# Patient Record
Sex: Male | Born: 1990 | Race: White | Hispanic: No | Marital: Single | Smoking: Never smoker
Health system: Southern US, Community
[De-identification: ages and names within clinical notes are randomized; demographics above are authoritative.]

## PROBLEM LIST (undated history)

## (undated) DIAGNOSIS — G809 Cerebral palsy, unspecified: Secondary | ICD-10-CM

## (undated) HISTORY — PX: EYE SURGERY: SHX253

## (undated) HISTORY — PX: ORTHOPEDIC SURGERY: SHX850

---
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
## 2014-01-15 ENCOUNTER — Ambulatory Visit: Payer: Self-pay | Admitting: Family Medicine

## 2015-08-17 ENCOUNTER — Other Ambulatory Visit: Payer: Self-pay | Admitting: Nurse Practitioner

## 2015-08-17 DIAGNOSIS — G804 Ataxic cerebral palsy: Secondary | ICD-10-CM

## 2015-08-17 DIAGNOSIS — M5410 Radiculopathy, site unspecified: Secondary | ICD-10-CM

## 2015-09-15 ENCOUNTER — Ambulatory Visit
Admission: RE | Admit: 2015-09-15 | Discharge: 2015-09-15 | Disposition: A | Discharge status: Home / Self Care | Payer: BC Managed Care – PPO | Source: Ambulatory Visit | Attending: Nurse Practitioner | Admitting: Nurse Practitioner

## 2015-09-15 DIAGNOSIS — G804 Ataxic cerebral palsy: Secondary | ICD-10-CM

## 2015-09-15 DIAGNOSIS — M5410 Radiculopathy, site unspecified: Secondary | ICD-10-CM

## 2015-09-15 MED ORDER — GADOBENATE DIMEGLUMINE 529 MG/ML IV SOLN
15.0000 mL | Freq: Once | INTRAVENOUS | Status: AC | PRN
  Administered 2015-09-15: 11 mL via INTRAVENOUS

## 2015-10-06 ENCOUNTER — Ambulatory Visit: Payer: BC Managed Care – PPO | Attending: Nurse Practitioner | Admitting: Physical Therapy

## 2015-10-06 DIAGNOSIS — M545 Low back pain, unspecified: Secondary | ICD-10-CM

## 2015-10-06 NOTE — Therapy (Addendum)
[place] [facility] MAIN [facility] SERVICES [address] [place], [place], [address] Phone: [phone]   Fax:  [phone]  Physical Therapy Evaluation  Patient Details  Name: AMONTAE NG MRN: [id number] Date of Birth: May 29, 1991 No Data Recorded  Encounter Date: 10/06/2015      PT End of Session - 10/06/15 1650    Visit Number 1   Number of Visits 25   Date for PT Re-Evaluation 12/28/15   PT Start Time 0430   PT Stop Time 0515   PT Time Calculation (min) 45 min      No past medical history on file.  No past surgical history on file.  There were no vitals filed for this visit.  Visit Diagnosis:  Low back pain at multiple sites - Plan: PT plan of care cert/re-cert      Subjective Assessment - 10/06/15 1642    Subjective Patient had a fall  in september , he had PT for 3 weeks and his pain got worse. He had a MRI with no positive results. [facility] wants to try [facility] and [facility] some water while he was walking. 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    Pertinent History CP, lenthening HC x 2 BLE, hamstring lengthening BLE, Hip surgery to prevent Adduction, foot restruction BLE, adductor lengthening   How long can you sit comfortably? 5 minutes   How long can you stand comfortably? 5 minutes   How long can you walk comfortably? 10 minutes   Diagnostic tests MRI   Patient Stated Goals have less pain   Currently in Pain? Yes   Pain Score 4    Pain Location Back   Pain Orientation Right;Left   Pain Descriptors / Indicators Stabbing;Constant;Penetrating   Pain Type Chronic pain      PAIN: + SLR crossed, prone knee flex test + , palpation tenderness to L4-L5  POSTURE: WFL   PROM/AROM: tight hamstrings, hip adductors, hip flexors,  BLE,   STRENGTH:  Graded on a 0-5 scale NT due to CP and muscle tightness and  spasticity                                                                  SENSATION: WNL   SPECIAL TESTS: + crossed SLR, + prone flex test, + spring test L4, L5  FUNCTIONAL MOBILITY: guarded and painful supine to prone and supine to sit    GAIT: Gait training with spc with slow gait speed due to back and thigh pain. Antalgic gait due to back pain   :                              Mechanical traction in supine 70 lbs 25 sec on/ 10 sec off x 15 minutes with patient reporting some pain relief following traction                          PT Education - 10/06/15 1648    Education provided Yes   Education Details  POC   Person(s) Educated Patient   Methods Explanation   Comprehension Verbalized understanding             PT Long Term Goals - 10/07/15 0903    PT LONG TERM GOAL #1   Title Patient will be independent in home exercise program to improve strength/mobility for better functional independence with ADLs   Time 8   Period Weeks   Status New   PT LONG TERM GOAL #2   Title Patient will report a worst pain of 3/10 on VAS in    low back         to improve tolerance with ADLs and reduced symptoms with activities   Time 8   Period Weeks   Status New   PT LONG TERM GOAL #3   Title Patient will increase lumbar extension strength to at least 4/5 as to improve gross strength for sitting/standing tolerance with better erect posture for increased tolerance with ADLs.    Time 8   Period Weeks   Status New               Plan - 10/07/15 0905    Clinical Impression Statement Patient is 25 yr old male who presents with low back pain and thigh pain that began after a fall 3 months ago. He is now ambulating with a spc due to pain and is not able to walk his required lengths to complete college  requirements to get to class.    Pt will benefit from skilled therapeutic intervention in order to improve on the following deficits Abnormal  gait;Pain;Decreased balance;Difficulty walking;Impaired flexibility;Decreased activity tolerance;Decreased endurance;Decreased range of motion;Decreased strength   Rehab Potential Fair   Clinical Impairments Affecting Rehab Potential Patients clinical presentation is evolving because his pain was one month ago and it is not getting better.   PT Frequency 2x / week   PT Duration 12 weeks   PT Treatment/Interventions Therapeutic exercise;Therapeutic activities;Gait training;Manual techniques;Cryotherapy;Electrical Stimulation;Other (comment)  ;umbar traction         Problem List There are no active problems to display for this patient.  Ezekiel Ina, PT, DPT [place], [place] S 10/07/2015, 12:25 PM  [place] [facility] MAIN [facility] SERVICES [address] [place], [place], [address] Phone: [phone]   Fax:  [phone]  Name: LEEVON UPPERMAN MRN: [id number] Date of Birth: 01/10/1991

## 2015-10-08 ENCOUNTER — Ambulatory Visit: Payer: BC Managed Care – PPO | Admitting: Physical Therapy

## 2015-10-08 DIAGNOSIS — M545 Low back pain, unspecified: Secondary | ICD-10-CM

## 2015-10-08 NOTE — Therapy (Signed)
[facility] MAIN [facility] SERVICES [address] [place], [place], [address] Phone: [phone]   Fax:  [phone]  Physical Therapy Treatment  Patient Details  Name: CHRYSTOPHER STANGL MRN: [id number] Date of Birth: [birth year] No Data Recorded  Encounter Date: 10/08/2015      PT End of Session - 10/08/15 1403    Visit Number 2   Number of Visits 25   Date for PT Re-Evaluation 12/28/15   PT Start Time 0100   PT Stop Time 0145   PT Time Calculation (min) 45 min   Equipment Utilized During Treatment Gait belt   Activity Tolerance Patient tolerated treatment well   Behavior During Therapy [facility] for tasks assessed/performed      No past medical history on file.  No past surgical history on file.  There were no vitals filed for this visit.  Visit Diagnosis:  Low back pain at multiple sites      Subjective Assessment - 10/08/15 1357    Subjective Patient had a fall  in september , he had PT for 3 weeks and his pain got worse. He had a MRI with no positive results. [facility] wants to try traction and they dont have it in the [facility]He fell on some water while he was walking. 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    Pertinent History CP, lenthening HC x 2 BLE, hamstring lengthening BLE, Hip surgery to prevent Adduction, foot restruction BLE, adductor lengthening   How long can you sit comfortably? 5 minutes   How long can you stand comfortably? 5 minutes   How long can you walk comfortably? 10 minutes   Diagnostic tests MRI   Patient Stated Goals have less pain     Patient was seen for lumbar traction 70 lbs on 20 sec/ off 10 sec for 20 minutes. Manual therapy for RLE hip : long axis distraction 30 bouts x 15 minutes with patient reporting decreased pain after treatment with less pressure. Instructed in HEP.                            PT  Education - 10/08/15 1359    Education provided Yes   Education Details POC   Person(s) Educated Patient   Methods Explanation   Comprehension Verbalized understanding             PT Long Term Goals - 10/07/15 0903    PT LONG TERM GOAL #1   Title Patient will be independent in home exercise program to improve strength/mobility for better functional independence with ADLs   Time 8   Period Weeks   Status New   PT LONG TERM GOAL #2   Title Patient will report a worst pain of 3/10 on VAS in    low back         to improve tolerance with ADLs and reduced symptoms with activities   Time 8   Period Weeks   Status New   PT LONG TERM GOAL #3   Title Patient will increase lumbar extension strength to at least 4/5 as to improve gross strength for sitting/standing tolerance with better erect posture for increased tolerance with ADLs.    Time 8   Period Weeks   Status New  Plan - 10/08/15 1404    Clinical Impression Statement Patient has 3/10 back and upper leg pain today and he says that he did not do any walking which can make it worse. Patietn was able to tolerate lumbar traction and manual therapy for lon axis distraction on RLE to decrease his low back and leg pain.    Pt will benefit from skilled therapeutic intervention in order to improve on the following deficits Abnormal gait;Pain;Decreased balance;Difficulty walking;Impaired flexibility;Decreased activity tolerance;Decreased endurance;Decreased range of motion;Decreased strength   Rehab Potential Fair   Clinical Impairments Affecting Rehab Potential Patients clinical presentation is evolving because his pain was one month ago and it is not getting better.   PT Frequency 2x / week   PT Duration 12 weeks   PT Treatment/Interventions Therapeutic exercise;Therapeutic activities;Gait training;Manual techniques;Cryotherapy;Electrical Stimulation;Other (comment)  ;umbar traction        Problem List There are  no active problems to display for this patient.  Ezekiel Ina, PT, DPT [place], [place] S 10/08/2015, 2:09 PM  [place] [facility] MAIN [facility] SERVICES [address] [place], [place], [address] Phone: [phone]   Fax:  [phone]  Name: NAHUN KRONBERG MRN: [id number] Date of Birth: [birth year]

## 2015-10-13 ENCOUNTER — Ambulatory Visit: Payer: BC Managed Care – PPO | Admitting: Physical Therapy

## 2015-10-15 ENCOUNTER — Ambulatory Visit: Payer: BC Managed Care – PPO | Admitting: Physical Therapy

## 2015-10-15 ENCOUNTER — Encounter: Payer: Self-pay | Admitting: Physical Therapy

## 2015-10-15 DIAGNOSIS — M545 Low back pain, unspecified: Secondary | ICD-10-CM

## 2015-10-15 NOTE — Patient Instructions (Addendum)
Pelvic Tilt  Lying on back with knees bent, Flatten back by tightening stomach muscles and rocking hips back Hold for 5 sec, Repeat __10__ times per set. Do __1__ sets per session. Do __2__ sessions per day.  http://orth.exer.us/134    Copyright  VHI. All rights reserved. Knee to Chest (Flexion)   Pull both knee toward chest. Feel stretch in lower back or buttock area. Breathing deeply, Hold __15__ seconds. Repeat with other knee. Repeat _2-3___ times. Do _2-3___ sessions per day.  http://gt2.exer.us/225   Copyright  VHI. All rights reserved.   Lower Trunk Rotation Stretch  Lying on back with knees bent, Keeping back flat and feet together, rotate knees side to side slowly and in pain free range of motion.  Hold _2___ seconds. Repeat for 1-2 minutes. Do __1__ sets per session. Do __2-3__ sessions per day.  http://orth.exer.us/122      Spinal Mobility (Cat / Camel): Flexion / Extension    Get ON TARGET. Knees on full roller, hands on flat up roller:  1.Cat: Buttocks up, arch spine segmentally, bottom to top: lift chest as head moves back, look up. 2.Camel: Reverse movement. Close eyes, lower head, tuck chin, compress chest and abdomen, round back. Hold _5__ seconds. Repeat _5__ times. Do __2_ sessions per day.  Copyright  VHI. All rights reserved.  Child Pose    Sitting on knees, fold body over legs and relax head and arms on floor. Hold for _2-3___ breaths. Repeat x2 reps  http://yg.exer.us/126   Copyright  VHI. All rights reserved.

## 2015-10-15 NOTE — Therapy (Signed)
[facility] MAIN [facility] SERVICES [address] [place], [place], [address] Phone: [phone]   Fax:  [phone]  Physical Therapy Treatment  Patient Details  Name: Omar West MRN: [id number] Date of Birth: Feb 03, 1991 No Data Recorded  Encounter Date: 10/15/2015      PT End of Session - 10/15/15 1427    Visit Number 3   Number of Visits 25   Date for PT Re-Evaluation 12/28/15   PT Start Time 1345   PT Stop Time 1435   PT Time Calculation (min) 50 min   Equipment Utilized During Treatment Gait belt   Activity Tolerance Patient tolerated treatment well;No increased pain   Behavior During Therapy [facility] for tasks assessed/performed      History reviewed. No pertinent past medical history.  History reviewed. No pertinent past surgical history.  There were no vitals filed for this visit.  Visit Diagnosis:  Low back pain at multiple sites      Subjective Assessment - 10/15/15 1354    Subjective Patient reports feeling more sore today; He reports relief from traction for about a day afterward and then the pain returns;    Pertinent History CP, lenthening HC x 2 BLE, hamstring lengthening BLE, Hip surgery to prevent Adduction, foot restruction BLE, adductor lengthening   How long can you sit comfortably? 5 minutes   How long can you stand comfortably? 5 minutes   How long can you walk comfortably? 10 minutes   Diagnostic tests MRI   Patient Stated Goals have less pain   Currently in Pain? Yes   Pain Score 4    Pain Location Back   Pain Orientation Lower   Pain Descriptors / Indicators Aching;Dull   Pain Type Chronic pain       TREATMENT: PT instructed patient in repeated flexed/extension; Patient reports increased discomfort with repeated extension; PT identified increased scoliotic curve with increased right lumbar paraspinal prominence and slight convex right, concave left in lower thoracic, upper lumbar region;  PT  instructed patient in lumbar flexion exercise: Hooklying:  Double knee to chest 20 sec hold x3; Lumbar trunk rotation x1 min with cues to avoid painful ROM and improve flexibility; Diaphragmatic breathing with posterior pelvic tilt x10;  Qped: Cat/camel stretch 5 sec hold x5 childs pose 20 sec hold x2;  Patient required min-moderate verbal/tactile cues for correct exercise technique and to increase core abdominal stabilization with UE/LE movement  PT applied lumbar mechanical traction, 85# pull, 50# rest, 20 sec on, 10 sec rest x 15 min; Patient reports less back after traction;                            PT Education - 10/15/15 1426    Education provided Yes   Education Details HEP, traction   Person(s) Educated Patient   Methods Explanation;Verbal cues;Handout   Comprehension Verbalized understanding;Returned demonstration;Verbal cues required             PT Long Term Goals - 10/07/15 0903    PT LONG TERM GOAL #1   Title Patient will be independent in home exercise program to improve strength/mobility for better functional independence with ADLs   Time 8   Period Weeks   Status New   PT LONG TERM GOAL #2   Title Patient will report a worst pain of 3/10 on VAS in    low back         to improve  tolerance with ADLs and reduced symptoms with activities   Time 8   Period Weeks   Status New   PT LONG TERM GOAL #3   Title Patient will increase lumbar extension strength to at least 4/5 as to improve gross strength for sitting/standing tolerance with better erect posture for increased tolerance with ADLs.    Time 8   Period Weeks   Status New               Plan - 10/15/15 1427    Clinical Impression Statement PT instructed patient in lumbar flexion stretches; He reports slight increased in discomfort with stretches but not significant; PT applied mechanical traction with increased pull to reduce discomfort. Patient reports less pain after  treatment session;    Pt will benefit from skilled therapeutic intervention in order to improve on the following deficits Abnormal gait;Pain;Decreased balance;Difficulty walking;Impaired flexibility;Decreased activity tolerance;Decreased endurance;Decreased range of motion;Decreased strength   Rehab Potential Fair   Clinical Impairments Affecting Rehab Potential Patients clinical presentation is evolving because his pain was one month ago and it is not getting better.   PT Frequency 2x / week   PT Duration 12 weeks   PT Treatment/Interventions Therapeutic exercise;Therapeutic activities;Gait training;Manual techniques;Cryotherapy;Electrical Stimulation;Other (comment)  ;umbar traction        Problem List There are no active problems to display for this patient.   Trotter,Margaret PT, DPT 10/15/2015, 4:53 PM  [place] [facility] MAIN [facility] SERVICES [address] [place], [place], [address] Phone: [phone]   Fax:  [phone]  Name: Omar West MRN: [id number] Date of Birth: [birth year]

## 2015-10-20 ENCOUNTER — Ambulatory Visit: Payer: BC Managed Care – PPO | Admitting: Physical Therapy

## 2015-10-20 ENCOUNTER — Encounter: Payer: Self-pay | Admitting: Physical Therapy

## 2015-10-20 DIAGNOSIS — M545 Low back pain, unspecified: Secondary | ICD-10-CM

## 2015-10-20 NOTE — Therapy (Signed)
[place] [facility] MAIN [facility] SERVICES [address] [place], [place], [address] Phone: [phone]   Fax:  [phone]  Physical Therapy Treatment  Patient Details  Name: Omar West MRN: [id number] Date of Birth: 1991-03-28 No Data Recorded  Encounter Date: 10/20/2015      PT End of Session - 10/20/15 1038    Visit Number 4   Number of Visits 25   Date for PT Re-Evaluation 12/28/15   PT Start Time 1025   PT Stop Time 1110   PT Time Calculation (min) 45 min   Activity Tolerance Patient tolerated treatment well;No increased pain   Behavior During Therapy [facility] for tasks assessed/performed      History reviewed. No pertinent past medical history.  History reviewed. No pertinent past surgical history.  There were no vitals filed for this visit.  Visit Diagnosis:  Low back pain at multiple sites      Subjective Assessment - 10/20/15 1037    Subjective Patient reports less back pain today; He reports doing some of the back stretches without discomfort. "I did have a little pain with the cat/camel stretch but the other stretches were good"   Pertinent History CP, lenthening HC x 2 BLE, hamstring lengthening BLE, Hip surgery to prevent Adduction, foot restruction BLE, adductor lengthening   How long can you sit comfortably? 5 minutes   How long can you stand comfortably? 5 minutes   How long can you walk comfortably? 10 minutes   Diagnostic tests MRI   Patient Stated Goals have less pain   Currently in Pain? Yes   Pain Score 3    Pain Location Back   Pain Orientation Lower   Pain Descriptors / Indicators Aching   Pain Type Chronic pain      TREATMENT: Prior to manual therapy:  PT applied lumbar mechanical traction, 85# pull, 50# rest, 20 sec on, 10 sec rest x 15 min; Patient exhibits good pull with less "slipping" during traction; Patient reports less back pain after traction;  Patient reported increased discomfort along  bilateral SI joints after lumbar traction;  Following traction PT performed manual therapy: Patient hooklying:  Passive single knee to chest 15 sec hold x2 bilaterally; Passive piriformis stretch 20 sec hold x2 bilaterally; Passive double knee to chest 15 sec hold x2; Patient in side lying: PT performed grade II-III counternutation/SI joint inferior glide 10 sec bouts x5 sets to promote lumbar flexion and SI joint mobility; did bilaterally;  Patient reports no pain along right SI joint pain after manual therapy but still has some discomfort on left side;  Finished treatment with moist heat to low back x10 min Dustin Flock)                          PT Education - 10/20/15 1038    Education provided Yes   Education Details traction   Person(s) Educated Patient   Methods Explanation   Comprehension Verbalized understanding             PT Long Term Goals - 10/07/15 0903    PT LONG TERM GOAL #1   Title Patient will be independent in home exercise program to improve strength/mobility for better functional independence with ADLs   Time 8   Period Weeks   Status New   PT LONG TERM GOAL #2   Title Patient will report a worst pain of 3/10 on VAS in    low back  to improve tolerance with ADLs and reduced symptoms with activities   Time 8   Period Weeks   Status New   PT LONG TERM GOAL #3   Title Patient will increase lumbar extension strength to at least 4/5 as to improve gross strength for sitting/standing tolerance with better erect posture for increased tolerance with ADLs.    Time 8   Period Weeks   Status New               Plan - 10/20/15 1038    Clinical Impression Statement PT applied lumbar mechanical traction for back pain; Patient able to tolerate increased pull with less slipping on traction machine. He reports less back pain after traction; He  did report discomfort along bilateral SI joints; PT performed manual therapy to promote SI  joint mobility for less tenderness; Patient would benefit from additional skilled PT Intervention to improve joint mobliity and reduce back pain;    Pt will benefit from skilled therapeutic intervention in order to improve on the following deficits Abnormal gait;Pain;Decreased balance;Difficulty walking;Impaired flexibility;Decreased activity tolerance;Decreased endurance;Decreased range of motion;Decreased strength   Rehab Potential Fair   Clinical Impairments Affecting Rehab Potential Patients clinical presentation is evolving because his pain was one month ago and it is not getting better.   PT Frequency 2x / week   PT Duration 12 weeks   PT Treatment/Interventions Therapeutic exercise;Therapeutic activities;Gait training;Manual techniques;Cryotherapy;Electrical Stimulation;Other (comment)  ;umbar traction   Consulted and Agree with Plan of Care Patient        Problem List There are no active problems to display for this patient.   Trotter,Margaret PT, DPT 10/20/2015, 2:11 PM  [place] [facility] MAIN [facility] SERVICES [address] [place], [place], [address] Phone: [phone]   Fax:  [phone]  Name: Omar West MRN: [id number] Date of Birth: 10-Oct-1990

## 2015-10-22 ENCOUNTER — Ambulatory Visit: Payer: BC Managed Care – PPO | Attending: Nurse Practitioner | Admitting: Physical Therapy

## 2015-10-22 ENCOUNTER — Encounter: Payer: Self-pay | Admitting: Physical Therapy

## 2015-10-22 DIAGNOSIS — M545 Low back pain, unspecified: Secondary | ICD-10-CM

## 2015-10-22 NOTE — Therapy (Signed)
[place] [facility] MAIN [facility] SERVICES [address] [place], [place], [address] Phone: [phone]   Fax:  [phone]  Physical Therapy Treatment  Patient Details  Name: Omar West MRN: [id number] Date of Birth: November 09, 1990 No Data Recorded  Encounter Date: 10/22/2015      PT End of Session - 10/22/15 1620    Visit Number 5   Number of Visits 25   Date for PT Re-Evaluation 12/28/15   PT Start Time 1035   PT Stop Time 1130   PT Time Calculation (min) 55 min   Activity Tolerance Patient tolerated treatment well;No increased pain   Behavior During Therapy [facility] for tasks assessed/performed      History reviewed. No pertinent past medical history.  History reviewed. No pertinent past surgical history.  There were no vitals filed for this visit.  Visit Diagnosis:  Low back pain at multiple sites      Subjective Assessment - 10/22/15 1100    Subjective Patient reports less central low back pain; However he continues to have pain in SI joints.    Pertinent History CP, lenthening HC x 2 BLE, hamstring lengthening BLE, Hip surgery to prevent Adduction, foot restruction BLE, adductor lengthening   How long can you sit comfortably? 5 minutes   How long can you stand comfortably? 5 minutes   How long can you walk comfortably? 10 minutes   Diagnostic tests MRI   Patient Stated Goals have less pain   Currently in Pain? Yes   Pain Score 5    Pain Location Back   Pain Orientation Lower   Pain Descriptors / Indicators Aching;Sore   Pain Type Chronic pain         TREATMENT: Prior to manual therapy:  PT applied lumbar mechanical traction, 85# pull, 50# rest, 20 sec on, 10 sec rest x 15 min; Patient exhibits good pull with less "slipping" during traction; Patient reports less back pain after traction;  Patient reported increased discomfort along bilateral SI joints after lumbar traction;  PT instructed patient in passive double knee to  chest 20 se chold x3; Passive single knee to chest 15 sec hold x2 bilaterally; PT Performed isometric hip extension in hooklying with manual resistance 5 sec hold x5 with grade II-III AP mobs to ASIS to promote posterior rotation of innominate x2 sets to each side; Patient reports slightly less discomfort following manual therapy; He continues to have tightness in lumbar spine with difficulty achieving double knee to chest;  After manual therapy, PT applied IFC TENs (interferential) to lumbar paraspinals x15 min concurrent with moist heat; Patient reports less SI Joint pain to 2/10 after treatment session;                          PT Education - 10/22/15 1620    Education provided Yes   Education Details SI joint mobility   Person(s) Educated Patient   Methods Explanation;Verbal cues   Comprehension Returned demonstration;Verbalized understanding;Verbal cues required             PT Long Term Goals - 10/07/15 0903    PT LONG TERM GOAL #1   Title Patient will be independent in home exercise program to improve strength/mobility for better functional independence with ADLs   Time 8   Period Weeks   Status New   PT LONG TERM GOAL #2   Title Patient will report a worst pain of 3/10 on VAS in  low back         to improve tolerance with ADLs and reduced symptoms with activities   Time 8   Period Weeks   Status New   PT LONG TERM GOAL #3   Title Patient will increase lumbar extension strength to at least 4/5 as to improve gross strength for sitting/standing tolerance with better erect posture for increased tolerance with ADLs.    Time 8   Period Weeks   Status New               Plan - 10/22/15 1621    Clinical Impression Statement PT applied lumbar mechanical traction; Patient seems to be responding well to traction with less centralized low back pain; He has started experiencing SI joint pain which could be related to either pressure from lumbar  traction or decreased pelvic mobility from CP; PT instructed patient in lumbar flexion exercise. He would benefit from additional skilled PT intervention to improve flexibility and reduce back pain;    Pt will benefit from skilled therapeutic intervention in order to improve on the following deficits Abnormal gait;Pain;Decreased balance;Difficulty walking;Impaired flexibility;Decreased activity tolerance;Decreased endurance;Decreased range of motion;Decreased strength   Rehab Potential Fair   Clinical Impairments Affecting Rehab Potential Patients clinical presentation is evolving because his pain was one month ago and it is not getting better.   PT Frequency 2x / week   PT Duration 12 weeks   PT Treatment/Interventions Therapeutic exercise;Therapeutic activities;Gait training;Manual techniques;Cryotherapy;Electrical Stimulation;Other (comment)  ;umbar traction   Consulted and Agree with Plan of Care Patient        Problem List There are no active problems to display for this patient.    Trotter,Margaret PT, DPT 10/22/2015, 4:28 PM  [place] [facility] MAIN [facility] SERVICES [address] [place], [place], [address] Phone: [phone]   Fax:  [phone]  Name: Omar West MRN: [id number] Date of Birth: [birth year]

## 2015-10-27 ENCOUNTER — Encounter: Payer: Self-pay | Admitting: Physical Therapy

## 2015-10-27 ENCOUNTER — Ambulatory Visit: Payer: BC Managed Care – PPO | Admitting: Physical Therapy

## 2015-10-27 DIAGNOSIS — M545 Low back pain, unspecified: Secondary | ICD-10-CM

## 2015-10-27 NOTE — Therapy (Signed)
[place] [facility] MAIN [facility] SERVICES [address] [place], [place], [address] Phone: [phone]   Fax:  [phone]  Physical Therapy Treatment  Patient Details  Name: Omar West MRN: [id number] Date of Birth: [birth year] No Data Recorded  Encounter Date: 10/27/2015      PT End of Session - 10/27/15 1813    Visit Number 6   Number of Visits 25   Date for PT Re-Evaluation 12/28/15   PT Start Time 0540   PT Stop Time 0620   PT Time Calculation (min) 40 min   Activity Tolerance Patient tolerated treatment well;No increased pain   Behavior During Therapy [facility] for tasks assessed/performed      History reviewed. No pertinent past medical history.  History reviewed. No pertinent past surgical history.  There were no vitals filed for this visit.  Visit Diagnosis:  Low back pain at multiple sites      Subjective Assessment - 10/27/15 1811    Subjective Patient had a fall 10/23/15 and he aggravated his back again. His pain is 4/10 to left side and SI joints.   Pertinent History CP, lenthening HC x 2 BLE, hamstring lengthening BLE, Hip surgery to prevent Adduction, foot restruction BLE, adductor lengthening   How long can you sit comfortably? 5 minutes   How long can you stand comfortably? 5 minutes   How long can you walk comfortably? 10 minutes   Diagnostic tests MRI   Patient Stated Goals have less pain                  TREATMENT:  Manual therapy:   Long axis distraction grade 3 , 30 bouts x 8  LLE and RLE;   PT performed therapeutic exercise following manual therapy: Patient hooklying:  Passive single knee to chest 15 sec hold x2 bilaterally; Passive piriformis stretch 20 sec hold x2 bilaterally; Passive double knee to chest 15 sec hold x2;   Patient reports 4/10 pain along right SI joint pain after manual therapy. E-stim IFC crossed pattern L4-5 S-1 x 20 mintues  Finished treatment with moist heat to low back x10  min Dustin Flock)                            PT Education - 10/27/15 1812    Education provided Yes   Education Details HEP and heat   Person(s) Educated Patient   Methods Explanation   Comprehension Verbalized understanding             PT Long Term Goals - 10/07/15 0903    PT LONG TERM GOAL #1   Title Patient will be independent in home exercise program to improve strength/mobility for better functional independence with ADLs   Time 8   Period Weeks   Status New   PT LONG TERM GOAL #2   Title Patient will report a worst pain of 3/10 on VAS in    low back         to improve tolerance with ADLs and reduced symptoms with activities   Time 8   Period Weeks   Status New   PT LONG TERM GOAL #3   Title Patient will increase lumbar extension strength to at least 4/5 as to improve gross strength for sitting/standing tolerance with better erect posture for increased tolerance with ADLs.    Time 8   Period Weeks   Status New  Plan - 10/27/15 1813    Clinical Impression Statement Patient was seen for manual therapy to BLE hips including long axis distraction with minimal change in back pain. He tolerated stretching to piriformis and low back  but continues to have 4/10 pain following. He also tolerated e-stim IFC to decrease low back pain.    Pt will benefit from skilled therapeutic intervention in order to improve on the following deficits Abnormal gait;Pain;Decreased balance;Difficulty walking;Impaired flexibility;Decreased activity tolerance;Decreased endurance;Decreased range of motion;Decreased strength   Rehab Potential Fair   Clinical Impairments Affecting Rehab Potential Patients clinical presentation is evolving because his pain was one month ago and it is not getting better.   PT Frequency 2x / week   PT Duration 12 weeks   PT Treatment/Interventions Therapeutic exercise;Therapeutic activities;Gait training;Manual  techniques;Cryotherapy;Electrical Stimulation;Other (comment)  ;umbar traction   Consulted and Agree with Plan of Care Patient        Problem List There are no active problems to display for this patient.  Ezekiel Ina, PT, DPT [place], Barkley Bruns S 10/27/2015, 6:17 PM  [place] [facility] MAIN [facility] SERVICES [address] [place], [place], [address] Phone: [phone]   Fax:  [phone]  Name: Omar West MRN: [id number] Date of Birth: 02-02-1991

## 2015-10-29 ENCOUNTER — Encounter: Payer: Self-pay | Admitting: Physical Therapy

## 2015-10-29 ENCOUNTER — Ambulatory Visit: Payer: BC Managed Care – PPO | Admitting: Physical Therapy

## 2015-10-29 DIAGNOSIS — M545 Low back pain, unspecified: Secondary | ICD-10-CM

## 2015-10-29 NOTE — Patient Instructions (Signed)
Strengthening: Hip Abductor - Resisted    With band looped around both legs above knees, push thighs apart. Repeat _10___ times per set. Do ___2_ sets per session. Do _2___ sessions per day.  http://orth.exer.us/688   Copyright  VHI. All rights reserved.  Abduction: Clam (Eccentric) - Side-Lying    Lie on side with knees bent. Lift top knee, keeping feet together. Keep trunk steady. Slowly lower for 3-5 seconds. _10__ reps per set, _2__ sets per day, _5__ days per week.   http://ecce.exer.us/64   Copyright  VHI. All rights reserved.  Band Walk: Side Stepping    Tie band around legs, just above knees. Step _10__ feet to one side, then step back to start. (stand in front of counter) Repeat _2-3__ laps Note: Small towel between band and skin eases rubbing.  http://plyo.exer.us/76   Copyright  VHI. All rights reserved.  KNEE: Extension, Long Arc Quad (Band)    Place band around leg and under other foot. Pull band forward until knee is straight. Hold 2___ seconds. Use __red______ band. _10__ reps per set, _2__ sets per day, __5_ days per week  Copyright  VHI. All rights reserved.  Hip Flexion / Knee Extension: Straight-Leg Raise (Eccentric)    Lie on back. Lift leg with knee straight. Slowly lower leg for 3-5 seconds. _10_ reps per set, _2__ sets per day, _5__ days per week.   Copyright  VHI. All rights reserved.  Extension: Single Leg (Machine)    Straighten leg to locked knee position, keeping foot flexed toward knee. Do ____ sets. Complete ____ repetitions.  http://st.exer.us/342   Copyright  VHI. All rights reserved.

## 2015-10-29 NOTE — Therapy (Signed)
[place] MAIN [facility] SERVICES [address] [place], [place], [address] Phone: [phone]   Fax:  [phone]  Physical Therapy Treatment  Patient Details  Name: Omar West MRN: [id number] Date of Birth: [birth year] No Data Recorded  Encounter Date: 10/29/2015      PT End of Session - 10/29/15 1342    Visit Number 7   Number of Visits 25   Date for PT Re-Evaluation 12/28/15   PT Start Time 2536   PT Stop Time 1345   PT Time Calculation (min) 33 min   Activity Tolerance Patient tolerated treatment well;No increased pain   Behavior During Therapy [facility] for tasks assessed/performed      History reviewed. No pertinent past medical history.  History reviewed. No pertinent past surgical history.  There were no vitals filed for this visit.  Visit Diagnosis:  Low back pain at multiple sites      Subjective Assessment - 10/29/15 1344    Subjective Patient reports still feeling some back pain. He reports that he feels that his back pain is at his baseline. He is not having as much SI joint pain;    Pertinent History CP, lenthening HC x 2 BLE, hamstring lengthening BLE, Hip surgery to prevent Adduction, foot restruction BLE, adductor lengthening   How long can you sit comfortably? 5 minutes   How long can you stand comfortably? 5 minutes   How long can you walk comfortably? 10 minutes   Diagnostic tests MRI   Patient Stated Goals have less pain   Currently in Pain? Yes   Pain Score 3    Pain Location Back   Pain Orientation Lower   Pain Descriptors / Indicators Aching;Sore   Pain Type Chronic pain      TREATMENT: Instructed patient in advanced HEP:  Hooklying; Red tband hip abduction x15; Sidelying hip abduction SLR x10;  Standing side stepping with red tband around knees 10 feet x3 laps each direction;  Sitting: Red tband LE LAQ x5 each LE; SLR without  Band x5 reps;  Educated patient in safe and not safe gym  equipment to use with instruction to avoid leg extension machine for less knee discomfort.  Patient required min-moderate verbal/tactile cues for correct exercise technique.  Patient reports being independent in gym program and is okay with DC at this time to return to HEP                           PT Education - 10/29/15 1342    Education provided Yes   Education Details HEP   Person(s) Educated Patient   Methods Explanation;Verbal cues;Handout   Comprehension Verbalized understanding;Returned demonstration;Verbal cues required             PT Long Term Goals - 10/29/15 1343    PT LONG TERM GOAL #1   Title Patient will be independent in home exercise program to improve strength/mobility for better functional independence with ADLs   Time 8   Period Weeks   Status Achieved   PT LONG TERM GOAL #2   Title Patient will report a worst pain of 3/10 on VAS in    low back         to improve tolerance with ADLs and reduced symptoms with activities   Time 8   Period Weeks   Status Achieved   PT LONG TERM GOAL #3   Title Patient will increase lumbar extension strength  to at least 4/5 as to improve gross strength for sitting/standing tolerance with better erect posture for increased tolerance with ADLs.    Time 8   Period Weeks   Status Partially Met               Plan - 10/29/15 1342    Clinical Impression Statement Instructed patient in BLE hip and knee strengthening exercise. He reports that his back pain has lessened to 3/10 which is his baseline. PT educated patient on importance of HEP compliance and return to gym activities. Patient was educated in hip abductor strengthening to provide more support to pelvic complex. He is independent in exercise and therefore is appropriate for DC at this time.    Pt will benefit from skilled therapeutic intervention in order to improve on the following deficits Abnormal gait;Pain;Decreased balance;Difficulty  walking;Impaired flexibility;Decreased activity tolerance;Decreased endurance;Decreased range of motion;Decreased strength   Rehab Potential Fair   Clinical Impairments Affecting Rehab Potential Patients clinical presentation is evolving because his pain was one month ago and it is not getting better.   PT Frequency 2x / week   PT Duration 12 weeks   PT Treatment/Interventions Therapeutic exercise;Therapeutic activities;Gait training;Manual techniques;Cryotherapy;Electrical Stimulation;Other (comment)  ;umbar traction   PT Next Visit Plan DC   PT Home Exercise Plan advanced- see patient instructions;    Consulted and Agree with Plan of Care Patient        Problem List There are no active problems to display for this patient.   Trotter,Margaret PT, DPT 10/29/2015, 1:45 PM  [place] MAIN [facility] SERVICES [address] [place], [place], [address] Phone: [phone]   Fax:  [phone]  Name: Omar West MRN: [id number] Date of Birth: 07/31/1991

## 2015-11-03 ENCOUNTER — Ambulatory Visit: Payer: BC Managed Care – PPO | Admitting: Physical Therapy

## 2015-11-05 ENCOUNTER — Ambulatory Visit: Payer: BC Managed Care – PPO | Admitting: Physical Therapy

## 2017-06-01 IMAGING — MR MR LUMBAR SPINE WO/W CM
4 of 8 series · 16 of 48 positions shown · IV contrast (11 ML MULTIHANCE)
Comparison: None.

CLINICAL DATA: Ataxia cerebral palsy, BILATERAL hip surgeries.
Worsening extremity pain, difficulty with mobility. Failure to
improve with physical therapy.

EXAM:
MRI LUMBAR SPINE WITHOUT AND WITH CONTRAST
TECHNIQUE: Multiplanar and multiecho pulse sequences of the lumbar spine were
obtained without and with intravenous contrast.
CONTRAST:  11mL MULTIHANCE GADOBENATE DIMEGLUMINE 529 MG/ML IV SOLN

[Series 2: T2 · sagittal · 4.0mm · 0.44mm/px · 4 of 17 slices shown (1 of 3)]
[im 1/17]
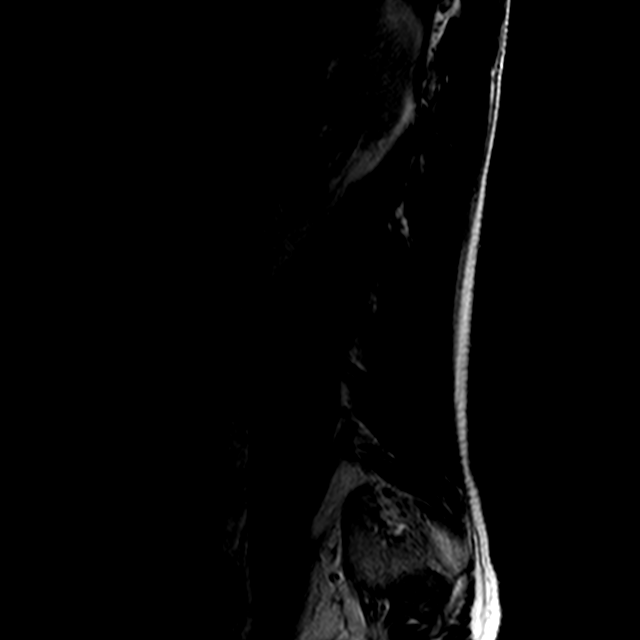
[im 6/17]
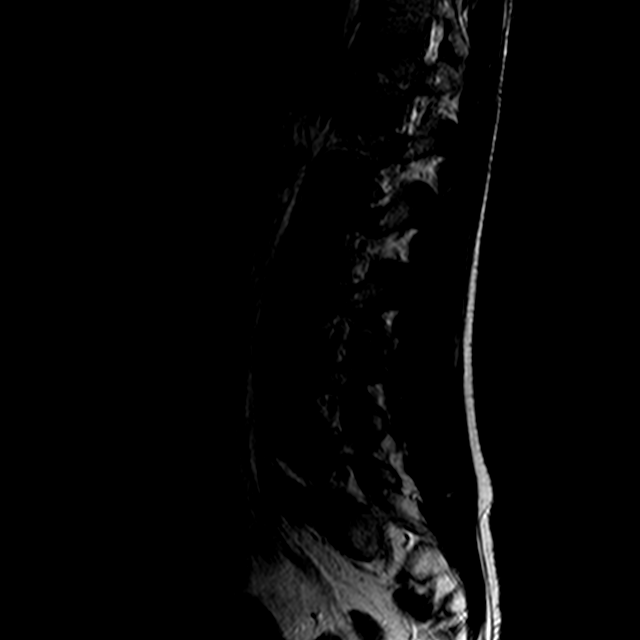
[im 11/17]
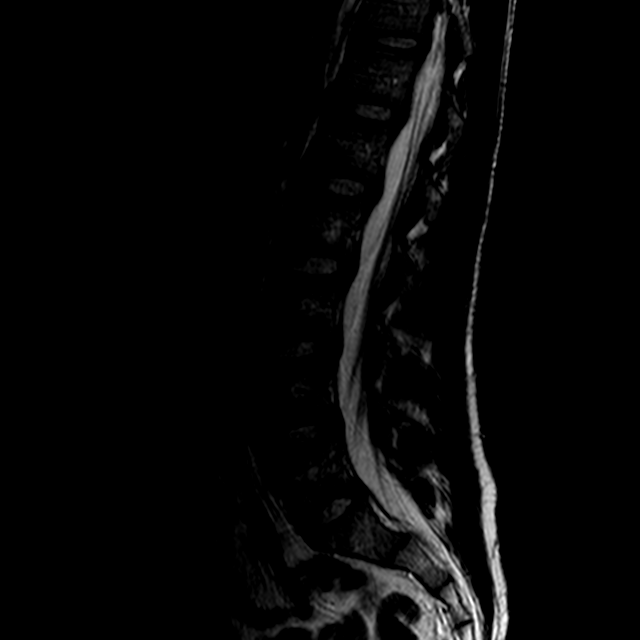
[im 17/17]
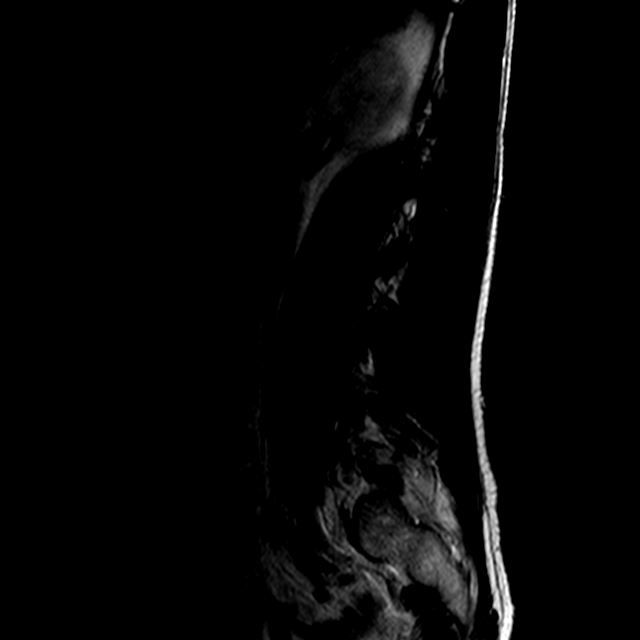

[Series 3: T1 · sagittal · 4.0mm · 0.44mm/px · 3 of 17 slices shown]
[im 1/17]
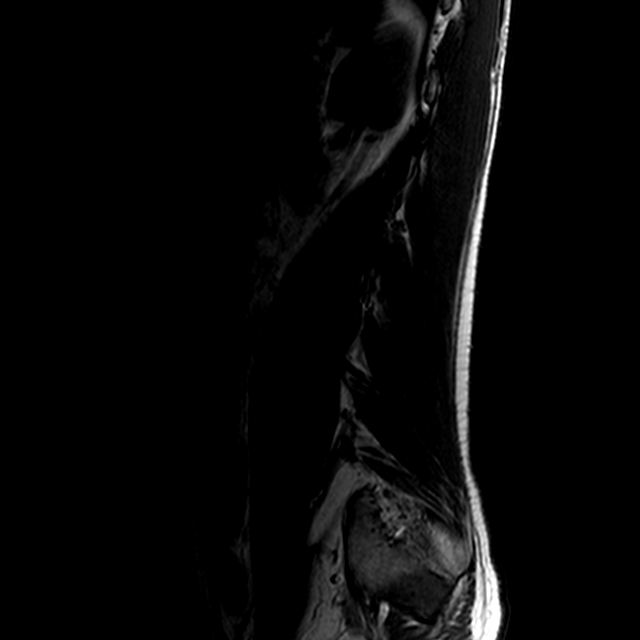
[im 11/17]
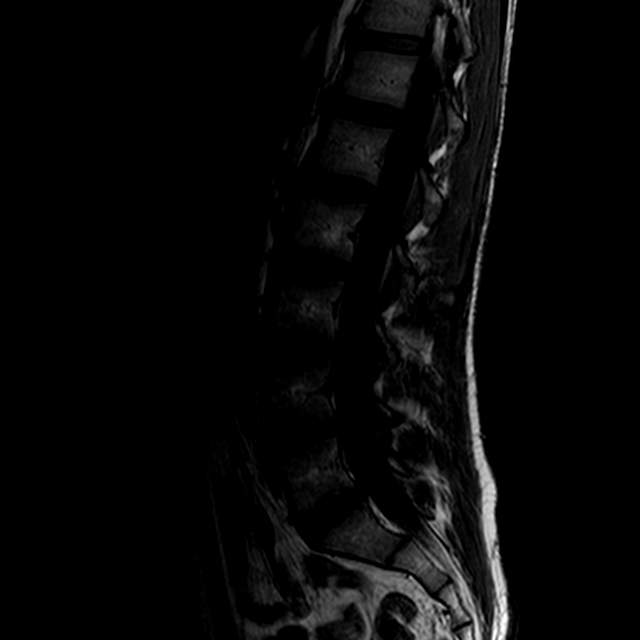
[im 17/17]
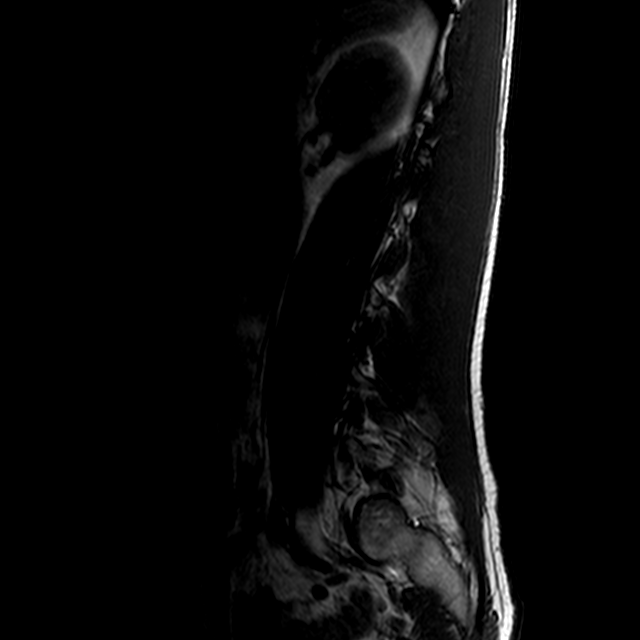

[Series 5: T2 · axial · 4.0mm · 0.39mm/px · z∈[-179,-7]mm · 6 of 34 slices shown (2 of 3)]
[im 1/34]
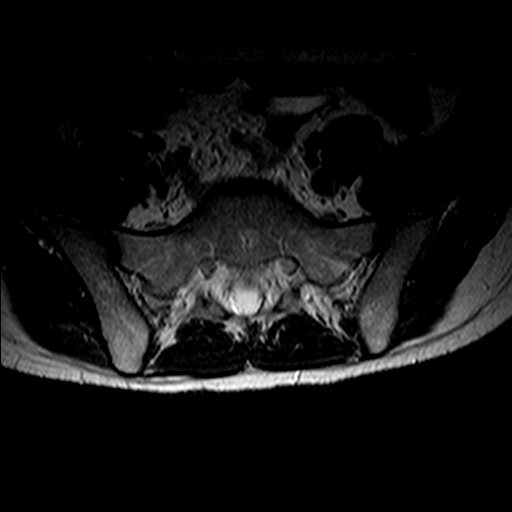
[im 5/34]
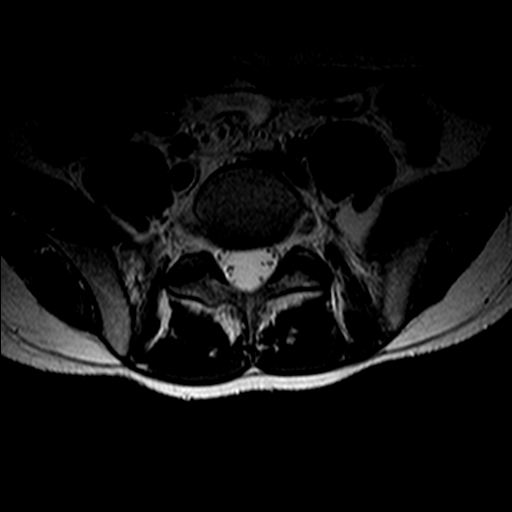
[im 10/34]
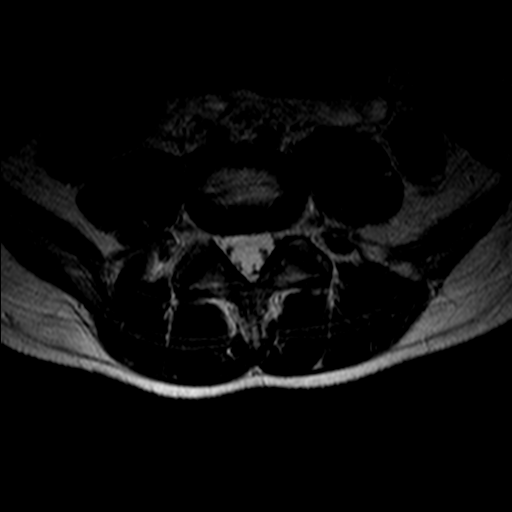
[im 15/34]
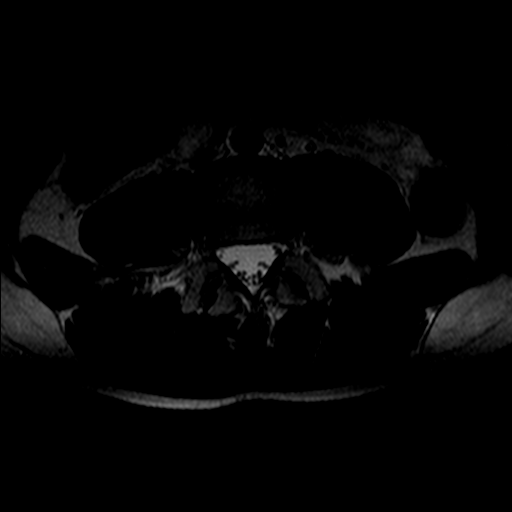
[im 19/34]
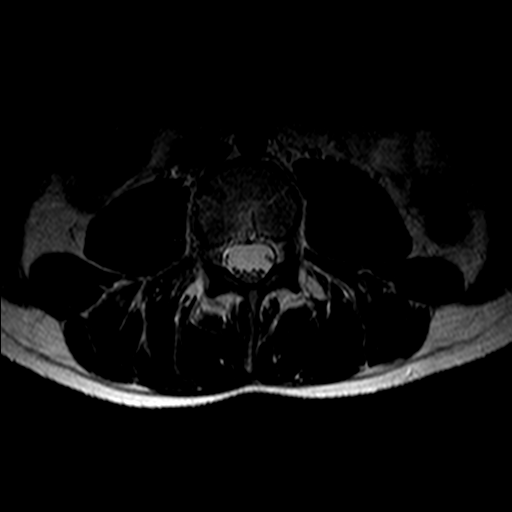
[im 29/34]
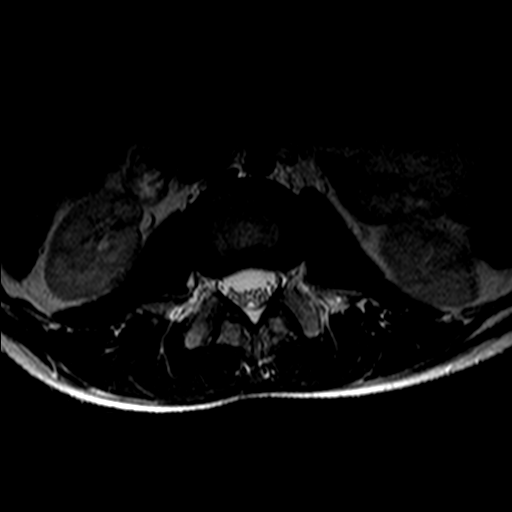

[Series 7: T2 · axial · 4.0mm · 0.39mm/px · z∈[-159,-7]mm · 3 of 34 slices shown (3 of 3)]
[im 5/34]
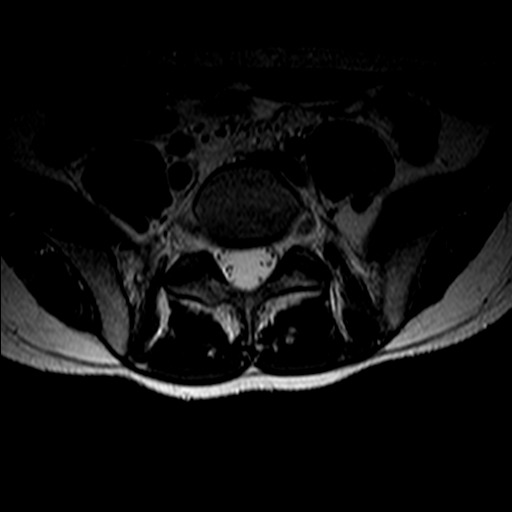
[im 19/34]
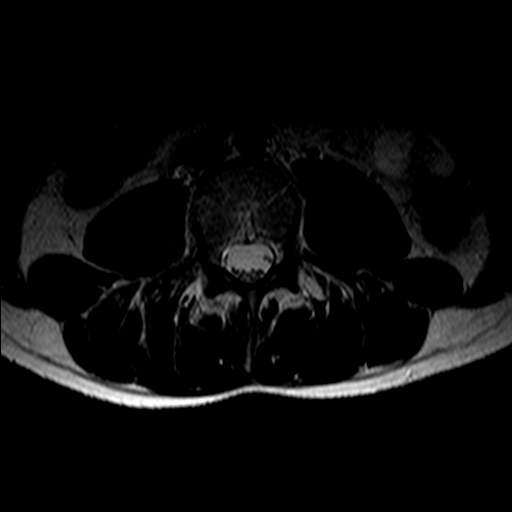
[im 29/34]
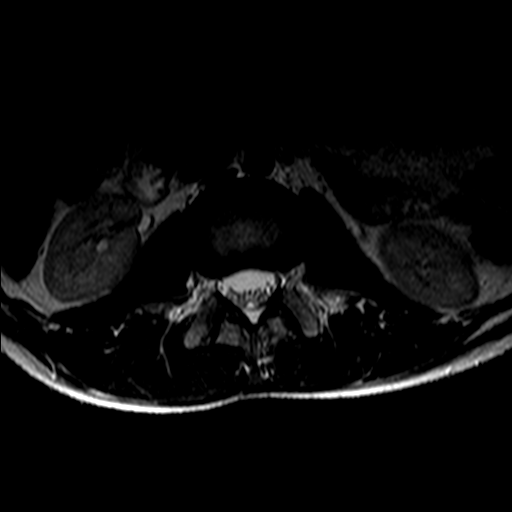

[16 of 48 positions shown; findings below may reference images not displayed]

FINDINGS: Anatomic alignment. Normal segmentation. Unremarkable conus. No
osseous findings. No pars defects or posterior element
abnormalities. Normal intraspinal contents. No evidence for occult
spinal dysraphism. Visualized paravertebral soft tissues and pelvis
unremarkable.

Post infusion, there is no abnormal enhancement visualized of the
vertebral bodies, disc spaces, intraspinal contents, or surrounding
soft tissues.

Axial images through the individual disc spaces the demonstrates no
disc protrusion, no spinal stenosis, no subarticular zone, or
foraminal zone narrowing.
IMPRESSION: Negative exam.

## 2017-12-10 ENCOUNTER — Ambulatory Visit
Admission: EM | Admit: 2017-12-10 | Discharge: 2017-12-10 | Disposition: A | Discharge status: Home / Self Care | Payer: BC Managed Care – PPO | Attending: Family Medicine | Admitting: Family Medicine

## 2017-12-10 ENCOUNTER — Other Ambulatory Visit: Payer: Self-pay

## 2017-12-10 DIAGNOSIS — T7840XA Allergy, unspecified, initial encounter: Secondary | ICD-10-CM

## 2017-12-10 HISTORY — DX: Cerebral palsy, unspecified: G80.9

## 2017-12-10 MED ORDER — DIPHENHYDRAMINE HCL 50 MG PO CAPS
50.0000 mg | ORAL_CAPSULE | Freq: Once | ORAL | Status: AC
  Administered 2017-12-10: 50 mg via ORAL

## 2017-12-10 NOTE — ED Provider Notes (Signed)
[facility]    CSN: [id number] Arrival date & time: 12/10/17  1554     History   Chief Complaint Chief Complaint  Patient presents with  . Allergic Reaction    HPI Omar West is a 27 y.o. male.   The history is provided by the patient.  Allergic Reaction  Presenting symptoms: rash   Presenting symptoms comment:  Denies wheezing, trouble breathing or throat swelling Severity:  Moderate Duration:  2 days Prior allergic episodes:  No prior episodes Context: medications (new medication; started Baclofen last week; took last dose this morning; states rash symptoms/flushing worse today; states he also recieved a steroid injection in his knee 2 days ago)   Relieved by:  Antihistamines (has been taking benadryl (25mg ) at a time yesterday and today with mild relief)   Past Medical History:  Diagnosis Date  . Cerebral palsy (HCC)     There are no active problems to display for this patient.   Past Surgical History:  Procedure Laterality Date  . EYE SURGERY    . ORTHOPEDIC SURGERY         Home Medications    Prior to Admission medications   Medication Sig Start Date End Date Taking? Authorizing Provider  baclofen (LIORESAL) 10 MG tablet Take 5-10 mg by mouth. 11/28/17 12/28/17 Yes [provider]  diphenhydrAMINE (BENADRYL) 25 MG tablet Take 25 mg by mouth every 6 (six) hours as needed.   Yes [provider]  traZODone (DESYREL) 50 MG tablet  10/09/17   [provider]    Family History History reviewed. No pertinent family history.  Social History Social History   Tobacco Use  . Smoking status: Never Smoker  . Smokeless tobacco: Never Used  Substance Use Topics  . Alcohol use: Not Currently  . Drug use: Not Currently     Allergies   Patient has no known allergies.   Review of Systems Review of Systems  Skin: Positive for rash.     Physical Exam Triage Vital Signs ED Triage Vitals  Enc Vitals Group       BP 12/10/17 1612 134/85     Pulse Rate 12/10/17 1612 84     Resp 12/10/17 1612 16     Temp 12/10/17 1612 98.4 F (36.9 C)     Temp Source 12/10/17 1612 Oral     SpO2 12/10/17 1612 100 %     Weight 12/10/17 1613 130 lb (59 kg)     Height 12/10/17 1613 5\' 5"  (1.651 m)     Head Circumference --      Peak Flow --      Pain Score 12/10/17 1612 3     Pain Loc --      Pain Edu? --      Excl. in GC? --    No data found.  Updated Vital Signs BP 133/84 (BP Location: Left Arm)   Pulse 83   Temp 98.4 F (36.9 C) (Oral)   Resp 16   Ht 5\' 5"  (1.651 m)   Wt 130 lb (59 kg)   SpO2 100%   BMI 21.63 kg/m   Visual Acuity Right Eye Distance:   Left Eye Distance:   Bilateral Distance:    Right Eye Near:   Left Eye Near:    Bilateral Near:     Physical Exam  Constitutional: He is oriented to person, place, and time. He appears well-developed and well-nourished. No distress.  HENT:  Mouth/Throat: Uvula is midline  and oropharynx is clear and moist. No uvula swelling. No oropharyngeal exudate or posterior oropharyngeal edema. No tonsillar exudate.  Cardiovascular: Normal rate, regular rhythm and normal heart sounds.  Pulmonary/Chest: Effort normal and breath sounds normal. No stridor. No respiratory distress. He has no wheezes. He has no rales.  Neurological: He is alert and oriented to person, place, and time.  Skin: Skin is warm. He is not diaphoretic. There is erythema (to facial skin, ears, neck area).  Psychiatric: He has a normal mood and affect.  Nursing note and vitals reviewed.    UC Treatments / Results  Labs (all labs ordered are listed, but only abnormal results are displayed) Labs Reviewed - No data to display  EKG None Radiology No results found.  Procedures Procedures (including critical care time)  Medications Ordered in UC Medications  diphenhydrAMINE (BENADRYL) capsule 50 mg (50 mg Oral Given 12/10/17 1629)     Initial Impression / Assessment and  Plan / UC Course  I have reviewed the triage vital signs and the nursing notes.  Pertinent labs & imaging results that were available during my care of the patient were reviewed by me and considered in my medical decision making (see chart for details).       Final Clinical Impressions(s) / UC Diagnoses   Final diagnoses:  Allergic reaction, initial encounter    ED Discharge Orders    None     1.diagnosis reviewed with patient;(likely reaction to new medication, Baclofen); patient given Benadryl 50mg  po x 1; recommend patient go to Emergency Department for further management and monitoring. Patient in stable condition, will proceed by private vehicle with wife driving.     Controlled Substance Prescriptions [place] Controlled Substance Registry consulted? Not Applicable   Payton Mccallumonty, Kwadwo Taras, MD 12/10/17 [phone]

## 2017-12-10 NOTE — Discharge Instructions (Signed)
Recommend patient go to Emergency Department for further evaluation, management and monitoring

## 2017-12-10 NOTE — ED Triage Notes (Signed)
Pt reports he thinks he is having a reaction to either a steroid injection he received 2 days ago or a new medication for his CP (Baclofen). Started yesterday with facial flushing, redness. Has been taking Benadryl with mild improvement. This afternoon it flared back up and has now spread to his trunk and has had hives off and on. Right arm and chest started hurting this afternoon as well.

## 2020-01-07 ENCOUNTER — Other Ambulatory Visit: Payer: Self-pay

## 2020-01-07 ENCOUNTER — Ambulatory Visit
Admission: EM | Admit: 2020-01-07 | Discharge: 2020-01-07 | Disposition: A | Discharge status: Home / Self Care | Payer: BC Managed Care – PPO | Attending: Urgent Care | Admitting: Urgent Care

## 2020-01-07 ENCOUNTER — Encounter: Payer: Self-pay | Admitting: Emergency Medicine

## 2020-01-07 DIAGNOSIS — W57XXXA Bitten or stung by nonvenomous insect and other nonvenomous arthropods, initial encounter: Secondary | ICD-10-CM

## 2020-01-07 DIAGNOSIS — S30860A Insect bite (nonvenomous) of lower back and pelvis, initial encounter: Secondary | ICD-10-CM

## 2020-01-07 DIAGNOSIS — R5383 Other fatigue: Secondary | ICD-10-CM

## 2020-01-07 DIAGNOSIS — R519 Headache, unspecified: Secondary | ICD-10-CM

## 2020-01-07 MED ORDER — DOXYCYCLINE HYCLATE 100 MG PO TABS
200.0000 mg | ORAL_TABLET | Freq: Once | ORAL | Status: AC
Start: 2020-01-07 — End: 2020-01-07
  Administered 2020-01-07: 10:00:00 200 mg via ORAL

## 2020-01-07 NOTE — Discharge Instructions (Addendum)
It was very nice seeing you today in clinic. Thank you for entrusting me with your care.   Rest and monitor symptoms. Increase fluid take. Risk of tick borne disease is low based on your history. Treated in clinic with a single prophylactic dose of antibiotics.  Make arrangements to follow up with your regular doctor in 1 week for re-evaluation if not improving. If your symptoms/condition worsens, please seek follow up care either here or in the ER. Please remember, our [facility] providers are "right here with you" when you need [place].   Again, it was my pleasure to take care of you today. Thank you for choosing our clinic. I hope that you start to feel better quickly.   Quentin Mulling, MSN, APRN, FNP-C, CEN Advanced Practice Provider [facility]

## 2020-01-07 NOTE — ED Provider Notes (Signed)
[place], [place]   Name: Omar West DOB: 1990/12/16 MRN: [id number] CSN: [id number] PCP: Sallee Lange, NP  Arrival date and time:  01/07/20 0859  Chief Complaint:  Headache and Fatigue  NOTE: Prior to seeing the patient today, I have reviewed the triage nursing documentation and vital signs. Clinical staff has updated patient's PMH/PSHx, current medication list, and drug allergies/intolerances to ensure comprehensive history available to assist in medical decision making.   History:   HPI: Omar West is a 29 y.o. male who presents today with complaints of being bitten by a tick. Patient notes that he removed a tick from his LEFT upper back  three days ago. Patient reports that he felt a "bump" on his back on Friday, however did not think much of it. When he had his wife look at the area on Saturday, the tick was discovered and subsequently removed. He is unsure what type of tick it was that bit him. He is unsure how long the tick was embedded. The tick was was noted to be engorged at the time it was removed by the patient. Patient notes that the tick was removed fully intact. Since the insect was removed from the patient, he notes that he has experienced some of the common symptoms associated with known tick borne illnesses. Patient has experienced fatigue, generalized headaches, a low grade temperature (Tmax 99), knee pain, and a diffusely distributed rash. He denies myalgias and weakness. He has not appreciated an associated erythema migrans rash at the site where he was bitten. Despite his symptoms, patient has not taken any over the counter interventions to help improve/relieve hisreported symptoms at home.   Past Medical History:  Diagnosis Date  . Cerebral palsy [facility])     Past Surgical History:  Procedure Laterality Date  . EYE SURGERY    . ORTHOPEDIC SURGERY      History reviewed. No pertinent family history.  Social History   Tobacco Use  . Smoking  status: Never Smoker  . Smokeless tobacco: Never Used  Substance Use Topics  . Alcohol use: Not Currently  . Drug use: Not Currently    There are no problems to display for this patient.   Home Medications:    Current Meds  Medication Sig  . traZODone (DESYREL) 50 MG tablet     Allergies:   Baclofen, Methocarbamol, and Tizanidine  Review of Systems (ROS):  Review of systems NEGATIVE unless otherwise noted in narrative H&P section.   Vital Signs: Today's Vitals   01/07/20 0920 01/07/20 0921 01/07/20 0923 01/07/20 0950  BP:   134/90   Pulse:   90   Resp:   18   Temp:   98 F (36.7 C)   TempSrc:   Oral   SpO2:   100%   Weight:  115 lb (52.2 kg)    Height:  5\' 5"  (1.651 m)    PainSc: 4    4     Physical Exam: Physical Exam  Constitutional: He is oriented to person, place, and time and well-developed, well-nourished, and in no distress.  HENT:  Head: Normocephalic and atraumatic.  Eyes: Pupils are equal, round, and reactive to light.  Cardiovascular: Normal rate, regular rhythm, normal heart sounds and intact distal pulses.  Pulmonary/Chest: Effort normal and breath sounds normal.  Neurological: He is alert and oriented to person, place, and time. He has normal sensation and intact cranial nerves.  Skin: Skin is warm and dry. Rash noted. He is not  diaphoretic.     Diffusely distributed pruritic rash. Rash is maculopapular and range in color from white/flesh toned to red.    Psychiatric: Memory, affect and judgment normal. His mood appears anxious.  Nursing note and vitals reviewed.   Urgent Care Treatments / Results:   No orders of the defined types were placed in this encounter.   LABS: PLEASE NOTE: all labs that were ordered this encounter are listed, however only abnormal results are displayed. Labs Reviewed - No data to display  EKG: -None  RADIOLOGY: No results found.  PROCEDURES: Procedures  MEDICATIONS RECEIVED THIS VISIT: Medications    doxycycline (VIBRA-TABS) tablet 200 mg (200 mg Oral Given 01/07/20 0948)    PERTINENT CLINICAL COURSE NOTES/UPDATES:   Initial Impression / Assessment and Plan / Urgent Care Course:  Pertinent labs & imaging results that were available during my care of the patient were personally reviewed by me and considered in my medical decision making (see lab/imaging section of note for values and interpretations).  Omar West is a 29 y.o. male who presents to [facility] today with complaints of Headache and Fatigue  Patient is well appearing overall in clinic today. He does not appear to be in any acute distress. Presenting symptoms (see HPI) and exam as documented above. Symptoms following tick removal 3 days ago. Discussed low rate of incidence for Lyme disease. Reviewed signs that would be a cause for concern for tick borne disease. Discussed CDC recommendations regarding prophylactic treatment and patient wishes to proceed. Patient treated with a single 200 mg of doxycycline in clinic today. He was advised to monitor symptoms and follow up should his current symptoms worsen, or if he develops any further concerning symptoms (fevers, fatigue, myalgias, erythema migrans, etc). May use Tylenol and/or Ibuprofen as needed for pain/fever.   Discussed follow up with primary care physician in 1 week for re-evaluation. I have reviewed the follow up and strict return precautions for any new or worsening symptoms. Patient is aware of symptoms that would be deemed urgent/emergent, and would thus require further evaluation either here or in the emergency department. At the time of discharge, he verbalized understanding and consent with the discharge plan as it was reviewed with him. All questions were fielded by provider and/or clinic staff prior to patient discharge.    Final Clinical Impressions / Urgent Care Diagnoses:   Final diagnoses:  Tick bite of back, initial encounter  Fatigue,  unspecified type  Acute nonintractable headache, unspecified headache type    New Prescriptions:  [place] Controlled Substance Registry consulted? Not Applicable  Meds ordered this encounter  Medications  . doxycycline (VIBRA-TABS) tablet 200 mg    Recommended Follow up Care:  Patient encouraged to follow up with the following provider within the specified time frame, or sooner as dictated by the severity of his symptoms. As always, he was instructed that for any urgent/emergent care needs, he should seek care either here or in the emergency department for more immediate evaluation.  Follow-up Information    Gauger, Hermenia Fiscal, NP In 1 week.   Specialty: Internal Medicine Why: General reassessment of symptoms if not improving Contact information: [address] [place] [address] [phone]         NOTE: This note was prepared using Dragon dictation software along with smaller phrase technology. Despite my best ability to proofread, there is the potential that transcriptional errors may still occur from this process, and are completely unintentional.    Wallace Cullens,  Cherre Huger, NP 01/07/20 2329

## 2020-01-07 NOTE — ED Triage Notes (Signed)
Patient states there was a tick on his back Saturday. He states he has had a headache and decreased energy since Sunday.

## 2022-01-17 ENCOUNTER — Ambulatory Visit: Payer: BC Managed Care – PPO

## 2022-01-27 ENCOUNTER — Ambulatory Visit: Payer: BC Managed Care – PPO

## 2022-02-24 ENCOUNTER — Ambulatory Visit: Payer: Self-pay | Attending: Nurse Practitioner

## 2022-02-24 DIAGNOSIS — G809 Cerebral palsy, unspecified: Secondary | ICD-10-CM

## 2022-02-24 DIAGNOSIS — R278 Other lack of coordination: Secondary | ICD-10-CM

## 2022-02-24 DIAGNOSIS — R2681 Unsteadiness on feet: Secondary | ICD-10-CM

## 2022-02-24 DIAGNOSIS — M6281 Muscle weakness (generalized): Secondary | ICD-10-CM

## 2022-02-24 DIAGNOSIS — R296 Repeated falls: Secondary | ICD-10-CM

## 2022-02-24 DIAGNOSIS — R269 Unspecified abnormalities of gait and mobility: Secondary | ICD-10-CM

## 2022-02-24 DIAGNOSIS — R262 Difficulty in walking, not elsewhere classified: Secondary | ICD-10-CM

## 2022-02-24 NOTE — Therapy (Signed)
[place] MAIN [facility] SERVICES [address] [place], [place], [address] Phone: [phone]   Fax:  [phone]  Physical Therapy Treatment  Patient Details  Name: Omar West MRN: [id number] Date of Birth: 1991-01-02 Referring Provider (PT): Gaetano Net   Encounter Date: 02/24/2022   PT End of Session - 02/24/22 1808     Visit Number 1    Number of Visits 1    Date for PT Re-Evaluation 02/24/22    Authorization Time Period 02/24/2022-02/24/2022    PT Start Time 1345    PT Stop Time 1445    PT Time Calculation (min) 60 min    Activity Tolerance Patient tolerated treatment well    Behavior During Therapy [facility] for tasks assessed/performed             Past Medical History:  Diagnosis Date   Cerebral palsy ([place])     Past Surgical History:  Procedure Laterality Date   EYE SURGERY     ORTHOPEDIC SURGERY      There were no vitals filed for this visit.   Subjective Assessment - 02/24/22 1354     Subjective Patient reports he is present today due to ongoing difficulty with mobility. States he is ambulating but falling daily without any significant injuries from these falls to date but concerned about his future. He reports daily pain from his Cerebral palsy and states he just can't walk as well as he used to. Today he is here to complete process for obtaining a power wheelchair.    Patient is accompained by: Family member   [name]   Pertinent History CP, lenthening HC x 2 BLE, hamstring lengthening BLE, Hip surgery to prevent Adduction, foot restruction BLE, adductor lengthening. Patient reports recent history of difficulty with mobility with daily falls, some difficulty with dressing, acute open sores on toes he related the nature of walking.    How long can you sit comfortably? No significant difficulty    How long can you stand comfortably? --    How long can you walk comfortably? --    Diagnostic tests --    Patient  Stated Goals Get around the home better and safer and go out and  be more independent in community    Currently in Pain? Yes    Pain Score 4     Pain Location --   Hips, back, Legs   Pain Orientation Right;Left    Pain Descriptors / Indicators Aching;Tightness    Pain Type Chronic pain    Pain Onset More than a month ago    Pain Frequency Constant    Aggravating Factors  Increased standing/walking    Pain Relieving Factors Rest    Effect of Pain on Daily Activities Difficulty with walking/standing              INTERVENTIONS:   PATIENT INFORMATION: This Evaluation form will serve as the LMN for the following suppliers:  Supplier:  [employer] Contact Person: Omar West, [employer] Phone: [phone]   Reason for Referral: Power wheelchair evaluation Patient/caregiver Goals: Get around the home better and safer and go out and be more independent in community  Patient was seen for face-to-face evaluation for new power wheelchair.  Also present was Omar West, [employer] to discuss recommendations and wheelchair options.  Further paperwork was completed and sent to vendor.  Patient appears to qualify for power mobility device at this time per objective findings.    MEDICAL HISTORY:CP, lenthening  HC x 2 BLE, hamstring lengthening BLE, Hip surgery to prevent Adduction, foot restruction BLE, adductor lengthening. Patient reports recent history of difficulty with mobility with daily falls, some difficulty with dressing, acute open sores on toes he related the nature of walking.  Diagnosis:Spastic Diplegic Cerebral Palsy; Other reduced mobility  Primary Diagnosis Onset: birth _0 Progressive Disease Relevant Past and Future Surgeries:lenthening HC x 2 BLE, hamstring lengthening BLE, Hip surgery to prevent Adduction, foot restruction BLE, adductor lengthening Height: 5'5" Weight: 120 Explain and recent changes or trends in weight: None  Relevant History including falls: Patient reports  falling almost daily due to ataxic gait, unable to use walker due to left hand/arm weakness.       HOME ENVIRONMENT: _1 House  _2 Condo/town home  _3 Apartment  _4 Assisted Living    _5 Lives Alone _6  Lives with Others: Wife                                                Hours with caregiver:   _7 Home is accessible to patient            Stairs  _8 Yes _9  No     Ramp _10 Yes _11 No Comments:  Patient has plans for ramp and level entry in garage   COMMUNITY ADL: TRANSPORTATION: _12 Car    _13 Van    <YQIHKVQQVZDGLOVF>_6<\/EPPIRJJOACZYSAYT>_01 Public Transportation    _15 Adapted w/c Lift   _16 Ambulance   _17 Other:       _18 Sits in wheelchair during transport  Employment/School:     Specific requirements pertaining to mobility                                                     Other:                                       FUNCTIONAL/SENSORY PROCESSING SKILLS:  Handedness:   _19 Right     _20 Left    _21 NA  Comments:                                 Functional Processing Skills for Wheeled Mobility _22 Processing Skills are adequate for safe wheelchair operation  Areas of concern than may interfere with safe operation of wheelchair Description of problem   _23  Attention to environment     _24 Judgment     _25  Hearing  _26  Vision or visual processing    _27 Motor Planning  _28  Lack of dexterity in left hand and increased overall arm weakness and fatigue.           -Left arm with Poor grip strength (8PSI)  and overall Left UE muscle weakness                                        VERBAL COMMUNICATION: _29 WFL receptive _30  WFL expressive _31 Understandable  _32 Difficult to understand  _33 non-communicative _34  Uses an augmented communication device    CURRENT SEATING / MOBILITY: Current Mobility Base:   _35 None  _36 Dependent  _37 Manual- Has  but does not use due to inability to propel independently (left UE weakness)   _0 Scooter  _1 Power   Type of Control:                       Manufacturer:                         Size:                          Age:                           Current Condition of Mobility Base:                                                                                                                     Current Wheelchair components:                                                                                                                                   Describe posture in present seating system:                                                                            SENSATION and SKIN ISSUES: Sensation _2 Intact _3 Impaired _4 Absent   Level of sensation:   Patient states some decreased sensation along B toes                        Pressure Relief: Able to perform effective pressure relief :   _5 Yes  _6  No Method:    UE assist to adjust  If not, Why?:                                                                          Skin Issues/Skin Integrity Current Skin Issues   _0 Yes _1 No  _2 Intact _3  Red area _4  Open Area  _5 Scar Tissue _6 At risk from prolonged sitting  Where                              History of Skin Issues   _7 Yes _8 No  Where :    Left great toe and second toe (patient going to [facility] next week for evaluation of wounds)                                     When: Acute- this week per patient                                            Hx of skin flap surgeries _9 Yes _10 No  Where                  When                                                  Limited sitting tolerance _11 Yes _12 No Hours spent sitting in wheelchair daily:                                                         Complaint of Pain:  Please describe:     Patient reports chronic and constant pain - West - mostly in hips, Lower legs/ankles- bilaterally                                                                                                        Swelling/Edema:     None reported or observed  ADL STATUS (in reference to wheelchair use):  Indep Assist Unable Indep with Equip Not assessed Comments  Dressing                  x                                     Wife assist with don shoes                  Eating      x                                                                                                                        Toileting      x                                                                                                                         Bathing      x                                                                                                                                Grooming/ Hygiene      x  Meal Prep      x                                                                                                                    IADLS      x                                                                                                            Bowel Management: _0 Continent  _1 Incontinent  _2 Accidents Comments:                                                  Bladder Management: _3 Continent  _4 Incontinent  _5 Accidents Comments:                                              WHEELCHAIR SKILLS: Manual w/c Propulsion: *Patient reports he has a manual w/c but does not use independently secondary to poor left grip and dexterity to propel w/c- only uses if family member is pushing him _6 UE or LE strength and endurance sufficient to participate in ADLs using manual wheelchair Arm :  _7 left _8 right  _9 Both                                   Foot:   _10 left _11 right  _12 Both  Distance:   Operate Scooter: _13  Strength, hand grip, balance and transfer appropriate for use _14 Living environment is accessible for use of scooter *Patient  reports difficulty with left grip and dexterity.   Operate Power w/c:  _15  Std. Joystick   _16  Alternative Controls Indep _17  Assist _18  Dependent/ Unable _19  N/A _20  _21 Safe          _22  Functional      Distance:                Bed confined without wheelchair _23  Yes _24  No   STRENGTH/RANGE OF MOTION:  Range of Motion Strength  Shoulder              Limited B shoulder less than 90 deg  R=    4/5 in available range; L= 3+/5 in available range                                              Elbow             WNL                                           R=4/5; L=3+/5                                                        Wrist/Hand       WNL                                                               R=4/5; L=3+/5 flex/ext Grip- R= 68 PSI; L=8 PSI                                                                  Hip     Limited to 110 deg (spastic)                                                                   3+/5 bilateral                                                      Knee     Limited ROM with both ext and Flex                                                             3+/5 bilateral                                                            Ankle Fixed- 0 deg B ankle DF  Unable to flex either ankle  MOBILITY/BALANCE:  _0  Patient is totally dependent for mobility                                                                                               Balance Transfers Ambulation  Sitting Balance: Standing Balance: Requires cane for balance _1  Independent _2  Independent/Modified Independent  _3  WFL     _4  WFL _5  Supervision _6  Supervision  _7  Uses UE for balance  _8  Supervision _9  Min Assist _10  Ambulates with Assist                           _11  Min Assist _12  Min assist _13  Mod Assist _14  Ambulates with Device:  _15  RW   _16  StW   _17  Cane   _18                 _19  Mod  Assist _20  Mod assist _21  Max assist   _22  Max Assist _23  Max assist _24  Dependent _25  Indep. Short Distance Only  _26  Unable _27  Unable _28  Lift / Sling Required Distance (in feet)    approx<200 feet- due to fatigue by muscle imbalance in LE- spasticity/difficulty coordination and prolonged recovery time making him inefficient. Patient also unsafe with report of daily falling now with walking.  Timed up and go test with cane= 24.69 sec  avg placing him at increased risk of falling.                        _29  Sliding board _30  Unable to Ambulate: (Explain:  Cardio Status:  _31 Intact  _32  Impaired   _33  NA                              Respiratory Status:  _34 Intact   _35 Impaired   _36 NA                                     Orthotics/Prosthetics:                                                                         Comments (Address manual vs power w/c vs scooter):    Patient not recommended for manual w/c or power scooter at this time. He presents with Left side arm/hand weakness- difficulty maintaining  grip on wheel of manual w/c and poor dexterity of left hand to propel w/c and easily fatigue left side. Patient only  using manual w/c with assist of caregivers. A power w/c would not be appropriate for same reasons plus safety concerns with transferring onto equipment with increased risk of falling.  Anterior / Posterior Obliquity Rotation-Pelvis  PELVIS    _0 Neutral  _1  Posterior  _2  Anterior     _3 WFL  _4 Right Elevated  _5 Left Elevated   _6 WFL  _7 Right Anterior _8   Left Anterior    _9  Fixed _10  Partly Flexible _11  Flexible  _12  Other  _13  Fixed  _14  Partly Flexible  _15  Flexible _16  Other  _17  Fixed  _18  Partly Flexible  _19  Flexible _20  Other  TRUNK _21 WFL _22 Thoracic Kyphosis _23 Lumbar Lordosis   _24  WFL _25 Convex Right _26 Convex Left   _27 c-curve _28 s-curve _29 multiple  _30  Neutral _31  Left-anterior _32  Right-anterior    _33   Fixed _34  Flexible _35  Partly Flexible       Other  _36  Fixed _37  Flexible _38  Partly Flexible _39  Other  _40  Fixed           _41  Flexible _42  Partly Flexible _43  Other   Position Windswept   HIPS  _44  Neutral _45  Abduct _46  ADduct _47  Neutral _48  Right _49  Left       _50  Fixed  _51  Partly Flexible             _52  Dislocated _53  Flexible _54  Subluxed    _55  Fixed _56  Partly Flexible  _57  Flexible _58  Other              Foot Positioning Knee Positioning   Knees and  Feet  _59  WFL _60 Left _61 Right _62  [facility] _63 Left _64 Right   KNEES ROM concerns: ROM concerns:   & Dorsi-Flexed                    _65 Lt _66 Rt- fixed ankle with very limited ROM                                  FEET Plantar Flexed                  _67 Lt _68 Rt     Inversion                    _69 Lt _70 Rt     Eversion                    _71 Lt _72 Rt    HEAD _73  Functional _74  Good Head Control   & _75  Flexed         _76  Extended _77  Adequate Head Control   NECK _78  Rotated  Lt  _79  Lat Flexed Lt _80  Rotated  Rt _81  Lat Flexed Rt _82  Limited Head Control    _83  Cervical Hyperextension _84  Absent  Head Control    SHOULDERS ELBOWS WRIST& HAND         Left     Right    Left     Right  U/E _85 Functional  Left            _86 Functional  Right                                 _87 Fisting             _88 Fisting     _89 elevated Left _90 depressed  Left _91 elevated Right _92 depressed  Right      _93 protracted Left _94 retracted Left _95 protracted Right _96 retracted Right _97 subluxed  Left              _98 subluxed  Right  Goals for Wheelchair Mobility  _0  Independence with mobility in the home with motor related ADLs (MRADLs)  _1  Independence with MRADLs in the community _2  Provide dependent mobility  _3  Provide recline     _4 Provide tilt   Goals for Seating system _5  Optimize pressure distribution _6  Provide support needed to facilitate function or safety _7  Provide corrective forces to assist with maintaining or improving posture _8   Accommodate client's posture: current seated postures and positions are not flexible or will not tolerate corrective forces _9  Client to be independent with relieving pressure in the wheelchair _10 Enhance physiological function such as breathing, swallowing, digestion  Simulation ideas/Equipment trials:                                                                                                State why other equipment was unsuccessful:    Patient has tried walker for short mobility- unable due to weakness in left UE and difficulty maintaining grip on left UE and coordination of device. He struggles using his cane in clinic with ataxic gait and increased risk of falling and patient reporting fatigue as well as daily falls. He states he is unable to use his manual w/c independently due to poor ability to grip wheel on left and left UE weakness.                                                                              MOBILITY BASE RECOMMENDATIONS and JUSTIFICATION: MOBILITY COMPONENT JUSTIFICATION  Manufacturer:    [employer]        Model:   Edge 3           Size: Width =16          Seat Depth    =18         _11 provide transport from point A to B _12 promote Indep mobility  _13 is not a safe, functional ambulator _14 walker or cane inadequate _15 non-standard width/depth necessary to accommodate anatomical measurement _16                             _17 Manual Mobility Base _18 non-functional ambulator    _19 Scooter/POV  _20 can safely operate  _21 can safely transfer   _22 has adequate trunk stability  _23 cannot functionally propel manual w/c  _24 Power Mobility Base  _25 non-ambulatory - Not safe - almost daily falling _26 cannot functionally propel manual wheelchair - decreased dexterity/Left side weakness  _27  cannot functionally and safely operate scooter/POV _28 can safely operate and willing to  _29 Stroller Base _30 infant/child  _31 unable to propel manual wheelchair _32 allows for growth  _33 non-functional ambulator _34 non-functional UE _35 Indep mobility is not a goal at this time  _36 Tilt  _37 Forward                   _38 Backward                  _39   Powered tilt              _0 Manual tilt  _1 change position against gravitational force on head and shoulders  _2 change position for pressure relief/cannot weight shift _3 transfers  _4 management of tone _5 rest periods _6 control edema _7 facilitate postural control  _8                                       _9 Recline  _10 Power recline on power base _11 Manual recline on manual base  _12 accommodate femur to back angle  _13 bring to full recline for ADL care  _14 change position for pressure relief/cannot weight shift _15 rest periods _16 repositioning for transfers or clothing/diaper /catheter changes _17 head positioning  _18 Lighter weight required _19 self- propulsion  _20 lifting _21                                                 _22 Heavy Duty required _23 user weight greater than 250# _24 extreme tone/ over active movement _25 broken frame on previous chair _26                                     _27  Back  _28  Angle Adjustable _29  Custom molded                           _30 postural control _31 control of tone/spasticity _32 accommodation of range of motion _33 UE functional control _34 accommodation for seating system _35                                          _36 provide lateral trunk support _37 accommodate deformity _38 provide posterior trunk support _39 provide lumbar/sacral support _40 support trunk in midline _41 Pressure relief over spinal processes  _42  Seat Cushion    Captain seat                    _43 impaired sensation  _44 decubitus ulcers present _45 history of pressure ulceration _46 prevent pelvic extension _47 low maintenance  _48 stabilize pelvis  _49 accommodate obliquity _50 accommodate multiple deformity _51 neutralize lower extremity position _52 increase pressure distribution _53                                           _54  Pelvic/thigh support  _55  Lateral  thigh guide _56  Distal medial pad  _57  Distal lateral pad _58  pelvis in neutral _59 accommodate pelvis _60  position upper legs _61  alignment _62  accommodate ROM _63  decrease adduction _64 accommodate tone _65 removable for transfers _66 decrease abduction  _67  Lateral trunk Supports _68  Lt     _69  Rt _70 decrease lateral trunk leaning _71 control tone _72 contour for increased contact _73 safety  _74 accommodate asymmetry _75                                                 _76  Mounting hardware  _77 lateral trunk supports  _78 back   _79 seat _80 headrest      _81  thigh support _82 fixed   _83 swing  away _0 attach seat platform/cushion to w/c frame _1 attach back cushion to w/c frame _2 mount postural supports _3 mount headrest  _4 swing medial thigh support away _5 swing lateral supports away for transfers  _6                                                     Armrests  _7 fixed _8 adjustable height  _9 removable   _10 swing away  _11 flip back   _12 reclining _13 full length pads _14 desk    _15 pads tubular  _16 provide support with elbow at 90   _17 provide support for w/c tray _18 change of height/angles for variable activities _19 remove for transfers _20 allow to come closer to table top _21 remove for access to tables _22                                               Hangers/ Leg rests  _23 60 _24 70 _25 90 _26 elevating _27 heavy duty  _28 articulating _29 fixed _30 lift off _31 swing away     _32 power _33 provide LE support  _34 accommodate to hamstring tightness _35 elevate legs during recline   _36 provide change in position for Legs _37 Maintain placement of feet on footplate _38 durability _39 enable transfers _40 decrease edema _41 Accommodate lower leg length _42                                         Foot support Footplate    <[id number]>_7<\/JIRCVELFYBOFBPZW>_25 Lt  _44  Rt  _45  Center mount _46 flip up                            _47 depth/angle adjustable _48 Amputee adapter    _49  Lt     _50  Rt _51 provide foot support _52 accommodate to ankle ROM _53 transfers _54 Provide support for residual  extremity _55  allow foot to go under wheelchair base _56  decrease tone  _57                                                 _58  Ankle strap/heel loops _59 support foot on foot support _60 decrease extraneous movement _61 provide input to heel  _62 protect foot  Tires: _63 pneumatic  _64 flat free inserts  _65 solid  _66 decrease maintenance  _67 prevent frequent flats _68 increase shock absorbency _69 decrease pain from road shock _70 decrease spasms from road shock _71                                              _72  Headrest  _73 provide posterior head support _74 provide posterior neck support _75 provide lateral head support _76 provide anterior head support _77 support during tilt and recline _78 improve feeding   _79 improve respiration _80 placement of switches _81 safety  _82 accommodate ROM  _83 accommodate tone _84 improve visual orientation  _85  Anterior chest strap _86  Vest _87  Shoulder retractors  _88 decrease forward movement of shoulder _89 accommodation of TLSO _90 decrease forward movement of trunk _91 decrease shoulder elevation _92 added abdominal support _93 alignment _94 assistance with shoulder control  _95   Pelvic Positioner _0 Belt _1 SubASIS bar _2 Dual Pull _3 stabilize tone _4 decrease falling out of chair/ **will not Decrease potential for sliding due to pelvic tilting _5 prevent excessive rotation _6 pad for protection over boney prominence _7 prominence comfort _8 special pull angle to control rotation _9                                                  Upper ExtremitySupport  _10 L   _11  R _12 Arm trough   _13 hand support _14  tray       _15 full tray _16 swivel mount _17 decrease edema      _18 decrease subluxation   _19 control tone   _20 placement for AAC/Computer/EADL _21 decrease gravitational pull on shoulders _22 provide midline positioning _23 provide support to increase UE function _24 provide hand support in natural position _25 provide work surface   POWER WHEELCHAIR CONTROLS   _26 Proportional  _27 Non-Proportional Type:   Joystick                                  _28 Left  _29 Right _30 provides access for controlling wheelchair   _31 lacks motor control to operate proportional drive control <[id number]>_2<\/WUXLKGMWNUUVOZDG>_64 unable to understand proportional controls  Actuator Control Module  _33 Single  _34 Multiple   _35 Allow the client to operate the power seat function(s) through the joystick control   _36 Safety Reset Switches _37 Used to change modes and stop the wheelchair when driving in latch mode    _38 [employer]   _39 programming for accurate control _40 progressive Disease/changing condition _41 non-proportional drive control needed _42 Needed in order to operate power seat functions through joystick control   _43 Display box _44 Allows user to see in which mode and drive the wheelchair is set  _45 necessary for alternate controls    _46 Digital interface electronics _47 Allows w/c to operate when using alternative drive controls  <QIHKVQQVZDGLOVFI>_4<\/PPIRJJOACZYSAYTK>_16 ASL Head Array _49 Allows client to operate wheelchair  through switches placed in tri-panel headrest  _50 Sip and puff with tubing kit _51 needed to operate sip and puff drive controls  <WFUXNATFTDDUKGUR>_4<\/YHCWCBJSEGBTDVVO>_16 Upgraded tracking electronics _53 increase safety when driving <WVPXTGGYIRSWNIOE>_7<\/OJJKKXFGHWEXHBZJ>_69 correct tracking when on uneven surfaces  _55 [facility] for switches or joystick _56 Attaches switches to w/c  _57 Swing away for access or transfers _58 midline for optimal placement _59 provides for consistent access  _60 Attendant controlled joystick plus mount _61 safety _62 long distance driving <CVELFYBOFBPZWCHE>_5<\/IDPOEUMPNTIRWERX>_54 operation of seat functions _64 compliance with transportation regulations _65                                             Rear wheel placement/Axle adjustability _66 None _67 semi adjustable _68 fully adjustable  _69 improved UE access to wheels _70 improved stability _71 changing angle in space for improvement of postural stability _72 1-arm drive access <MGQQPYPPJKDTOIZT>_2<\/WPYKDXIPJASNKNLZ>_76 amputee pad placement _74                                Wheel rims/ hand rims  _75 metal   _76 plastic  coated _77 oblique projections           _78 vertical projections _79 Provide ability to propel manual wheelchair  _80  Increase self-propulsion with hand weakness/decreased grasp  Push handles _81 extended   _82 angle adjustable              _83 standard _84 caregiver access _85 caregiver assist _86 allows "hooking" to enable increased ability  to perform ADLs or maintain balance  One armed device   _0 Lt   _1 Rt _2 enable propulsion of manual wheelchair with one arm   _3                                            Brake/wheel lock extension _4  Lt   _5  Rt _6 increase indep in applying wheel locks   _7 Side guards _8 prevent clothing getting caught in wheel or becoming soiled _9  prevent skin tears/abrasions  Battery:   NF22x2                                         _10 to power wheelchair                                                         Other:                                                                                                                        The above equipment has a life- long use expectancy. Growth and changes in medical and/or functional conditions would be the exceptions. This is to certify that the therapist has no financial relationship with durable medical provider or manufacturer. The therapist will not receive remuneration of any kind for the equipment recommended in this evaluation.   Patient has mobility limitation that significantly impairs safe, timely participation in one or more mobility related ADL's. (bathing, toileting, feeding, dressing, grooming, moving from room to room)  _11  Yes _12  No  Will mobility device sufficiently improve ability to participate and/or be aided in participation of MRADL's?      _13  Yes _14  No  Can limitation be compensated for with use of a cane or walker?                                    _15  Yes _16  No  Does patient or caregiver demonstrate ability/potential ability & willingness to safely use the mobility device?    _17  Yes _18  No  Does patient's home  environment support use of recommended mobility device?            _19  Yes _20  No  Does patient have sufficient upper extremity function necessary to functionally propel a manual wheelchair?     _21  Yes _22  No  Does patient have sufficient strength and trunk stability to safely operate a POV (scooter)?                                  _23   Yes _0  No  Does patient need additional features/benefits provided by a power wheelchair for MRADL's in the home?        _1  Yes _2  No  Does the patient demonstrate the ability to safely use a power wheelchair?                   _3  Yes _4  No     Physician's Name Printed:                                                     Physician's Signature:  Date:     This is to certify that I, the above signed therapist have the following affiliations: _5  This DME provider _6  Manufacturer of recommended equipment _7  Patient's long term care facility _8  None of the above  Therapist Name/Signature:                                            Date:    [facility] PT Assessment - 02/24/22 1804       Assessment   Medical Diagnosis Cerebral Palsy    Referring Provider (PT) Gaetano Net    Hand Dominance Right      Precautions   Precautions Fall      Restrictions   Weight Bearing Restrictions No      Balance Screen   Has the patient fallen in the past 6 months Yes    How many times? "Daily"    Has the patient had a decrease in activity level because of a fear of falling?  Yes    Is the patient reluctant to leave their home because of a fear of falling?  Yes      [place] Private residence    Living Arrangements Spouse/significant other    Available Help at Discharge Family    Type of [place] Access Level entry    [place] - single point;Wheelchair - manual      Prior Function   Level of Independence Requires assistive device for independence;Needs assistance with ADLs;Needs  assistance with homemaking      Cognition   Overall Cognitive Status Within Functional Limits for tasks assessed    Attention Focused    Focused Attention Appears intact    Memory Appears intact    Awareness Appears intact    Problem Solving Appears intact    Executive Function Reasoning;Sequencing;Organizing;Decision Making;Initiating                                   PT Education - 02/24/22 1808     Education provided Yes    Education Details Purpose of w/c eval    Person(s) Educated Patient    Methods Explanation    Comprehension Verbalized understanding              PT Short Term Goals - 02/24/22 1813       PT SHORT TERM GOAL #1   Title Pt and caregivers will understand PT recommendation and appropriate/safe use for wheelchair and seating for home use  Baseline 02/24/2022= Patient participated in w/c eval and understanding of PT recommendation for obtaining Power w/c    Time 1    Period Days    Status Achieved    Target Date 02/24/22               PT Long Term Goals - 10/29/15 1343       PT LONG TERM GOAL #1   Title Patient will be independent in home exercise program to improve strength/mobility for better functional independence with ADLs    Time 8    Period Weeks    Status Achieved      PT LONG TERM GOAL #2   Title Patient will report a worst pain of 3/10 on VAS in    low back         to improve tolerance with ADLs and reduced symptoms with activities    Time 8    Period Weeks    Status Achieved      PT LONG TERM GOAL #3   Title Patient will increase lumbar extension strength to at least 4/5 as to improve gross strength for sitting/standing tolerance with better erect posture for increased tolerance with ADLs.     Time 8    Period Weeks    Status Partially Met                   Plan - 02/24/22 1809     Clinical Impression Statement Patient presents today for power w/c evaluation. He has diagnosis of Spastic  Diplegic Cerebral Palsy; Other reduced mobility. He presents with poor functional mobility at this time- able to walk with ataxia and use of cane but short distances and increased risk of falling as seen by TUG score. He also presents with impaired left UE muscle strength with significant grip weakness and poor overall dexterity which have impaired him from using a walker or manual w/c. He does have some spasticity as well which worsens with walking and current has 2 open sores on left foot due to walking and going to see his MD next week for further evaluation. At this time patient would greatly benefit from a powered wheelchair to improve his overall safety with mobility in home and communtiy and decrease his fall risk while maximizing his functional independence.    Rehab Potential Good    PT Frequency One time visit    PT Duration Other (comment)   1 time wheelchair eval   PT Next Visit Plan PT wheelchair eval only    Consulted and Agree with Plan of Care Patient;Family member/caregiver    Family Member Consulted FatherMerry West             Patient will benefit from skilled therapeutic intervention in order to improve the following deficits and impairments:     Visit Diagnosis: Cerebral palsy, unspecified type ([place])  Muscle weakness (generalized)  Difficulty in walking, not elsewhere classified  Abnormality of gait and mobility  Other lack of coordination  Unsteadiness on feet  Repeated falls     Problem List There are no problems to display for this patient.   Omar West, PT 02/25/2022, 11:32 AM  [place] [facility] [address] [place], [place], [address] Phone: [phone]   Fax:  [phone]  Name: Omar West MRN: [id number] Date of Birth: August 22, 1991
